# Patient Record
Sex: Male | Born: 1985 | Hispanic: No | Marital: Single | Smoking: Never smoker
Health system: Southern US, Community
[De-identification: ages and names within clinical notes are randomized; demographics above are authoritative.]

## PROBLEM LIST (undated history)

## (undated) DIAGNOSIS — F951 Chronic motor or vocal tic disorder: Secondary | ICD-10-CM

## (undated) DIAGNOSIS — F840 Autistic disorder: Secondary | ICD-10-CM

## (undated) DIAGNOSIS — T560X1A Toxic effect of lead and its compounds, accidental (unintentional), initial encounter: Secondary | ICD-10-CM

## (undated) DIAGNOSIS — G4700 Insomnia, unspecified: Secondary | ICD-10-CM

## (undated) DIAGNOSIS — F429 Obsessive-compulsive disorder, unspecified: Secondary | ICD-10-CM

## (undated) DIAGNOSIS — E7800 Pure hypercholesterolemia, unspecified: Secondary | ICD-10-CM

## (undated) DIAGNOSIS — T7840XA Allergy, unspecified, initial encounter: Secondary | ICD-10-CM

## (undated) DIAGNOSIS — F411 Generalized anxiety disorder: Secondary | ICD-10-CM

## (undated) HISTORY — DX: Insomnia, unspecified: G47.00

## (undated) HISTORY — DX: Generalized anxiety disorder: F41.1

## (undated) HISTORY — DX: Obsessive-compulsive disorder, unspecified: F42.9

## (undated) HISTORY — DX: Allergy, unspecified, initial encounter: T78.40XA

## (undated) HISTORY — DX: Pure hypercholesterolemia, unspecified: E78.00

## (undated) HISTORY — DX: Chronic motor or vocal tic disorder: F95.1

## (undated) HISTORY — DX: Toxic effect of lead and its compounds, accidental (unintentional), initial encounter: T56.0X1A

## (undated) HISTORY — DX: Autistic disorder: F84.0

---

## 2005-06-21 ENCOUNTER — Emergency Department (HOSPITAL_COMMUNITY): Admission: EM | Admit: 2005-06-21 | Discharge: 2005-06-21 | Payer: Self-pay | Admitting: Emergency Medicine

## 2009-01-05 ENCOUNTER — Ambulatory Visit (HOSPITAL_COMMUNITY): Admission: RE | Admit: 2009-01-05 | Discharge: 2009-01-05 | Payer: Self-pay | Admitting: Dentistry

## 2010-06-16 LAB — LIPID PANEL
HDL: 44 mg/dL (ref >39)
LDL Cholesterol: 161 mg/dL — ABNORMAL HIGH (ref 0–99)
Total CHOL/HDL Ratio: 5.2 RATIO
Triglycerides: 125 mg/dL (ref <150)
VLDL: 25 mg/dL (ref 0–40)

## 2010-06-16 LAB — COMPREHENSIVE METABOLIC PANEL
Albumin: 4.3 g/dL (ref 3.5–5.2)
Alkaline Phosphatase: 45 U/L (ref 39–117)
BUN: 10 mg/dL (ref 6–23)
Calcium: 9.3 mg/dL (ref 8.4–10.5)
Potassium: 4 mEq/L (ref 3.5–5.1)
Total Protein: 7.4 g/dL (ref 6.0–8.3)

## 2010-06-16 LAB — POCT I-STAT 4, (NA,K, GLUC, HGB,HCT): Glucose, Bld: 94 mg/dL (ref 70–99)

## 2010-06-16 LAB — CBC
HCT: 46.5 % (ref 39.0–52.0)
MCHC: 34.9 g/dL (ref 30.0–36.0)
Platelets: 252 10*3/uL (ref 150–400)
RDW: 13.6 % (ref 11.5–15.5)

## 2010-08-03 ENCOUNTER — Encounter: Payer: Self-pay | Admitting: *Deleted

## 2011-01-12 ENCOUNTER — Telehealth: Payer: Self-pay | Admitting: *Deleted

## 2011-01-12 NOTE — Telephone Encounter (Signed)
Called patient's father, Marcelene Butte re: form that had been dropped off for Dr.Knapp to fill out. Left message informing patient's father that she would not be able to sign off on this form because Eldwin hadn't been seen since 09/2008(last [facility] note), maybe Dr.Cunningham(his psychiatrist) would be able to. Instructed him to call if he had any questions or wanted to schedule an appt for [facility]. (form is on inside left of paper chart)

## 2013-06-18 ENCOUNTER — Telehealth: Payer: Self-pay | Admitting: *Deleted

## 2013-06-18 NOTE — Telephone Encounter (Signed)
Patient's father called requesting 2 referrals. First one to [facility] and also one to [facility] [phone] for speech evaluation for augmentative communication device. Patient has been healthy just having some behavioral issues. I called and set them up for tomorrow @ 2:45 for a 30 minute appt as patient has never been seen at this office. Is that ok?

## 2013-06-18 NOTE — Telephone Encounter (Signed)
That's fine

## 2013-06-19 ENCOUNTER — Encounter: Payer: Self-pay | Admitting: Family Medicine

## 2013-06-19 ENCOUNTER — Ambulatory Visit (INDEPENDENT_AMBULATORY_CARE_PROVIDER_SITE_OTHER): Payer: BC Managed Care – PPO | Admitting: Family Medicine

## 2013-06-19 VITALS — BP 118/80 | HR 80 | Ht 69.0 in | Wt 226.0 lb

## 2013-06-19 DIAGNOSIS — F951 Chronic motor or vocal tic disorder: Secondary | ICD-10-CM

## 2013-06-19 DIAGNOSIS — F840 Autistic disorder: Secondary | ICD-10-CM

## 2013-06-19 DIAGNOSIS — J309 Allergic rhinitis, unspecified: Secondary | ICD-10-CM | POA: Insufficient documentation

## 2013-06-19 DIAGNOSIS — F411 Generalized anxiety disorder: Secondary | ICD-10-CM | POA: Insufficient documentation

## 2013-06-19 DIAGNOSIS — F429 Obsessive-compulsive disorder, unspecified: Secondary | ICD-10-CM

## 2013-06-19 DIAGNOSIS — R4689 Other symptoms and signs involving appearance and behavior: Secondary | ICD-10-CM

## 2013-06-19 DIAGNOSIS — F603 Borderline personality disorder: Secondary | ICD-10-CM
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
## 2013-06-19 NOTE — Patient Instructions (Signed)
We will be getting those referrals done as requested.  Let us know if you have any further problems or needs.

## 2013-06-25 ENCOUNTER — Telehealth: Payer: Self-pay | Admitting: *Deleted

## 2013-06-25 NOTE — Telephone Encounter (Signed)
They specifically wanted a psychologist, not a psychiatrist--he already has two psychiatrists, who manage his medications.  They are concerned about his aggressive behavior of recent, and what could be causing it.  Psychiatrists have prescribed him medication (ativan) for prn use.  Was William Weeks aware of this?

## 2013-06-25 NOTE — Telephone Encounter (Signed)
William Weeks from [facility] called and their NP has reviewed the referral to see Dr.Altabet. They are stating that they cannot see the patient as patient needs to be referral to a psychiatrist-Dr.Altabet is a psychologist.  William Weeks [phone]

## 2013-06-26 NOTE — Telephone Encounter (Signed)
Called and spoke with [name]. She was not aware that William Weeks already had 2 psychiatrists and was seeking Dr.Altabet as a psychologist. She will let the NP know and call me back to let me know the outcome.

## 2015-01-25 ENCOUNTER — Ambulatory Visit (INDEPENDENT_AMBULATORY_CARE_PROVIDER_SITE_OTHER): Payer: BC Managed Care – PPO | Admitting: Medical

## 2015-01-25 ENCOUNTER — Encounter: Payer: Self-pay | Admitting: Medical

## 2015-01-25 VITALS — BP 110/80 | HR 67 | Wt 238.0 lb

## 2015-01-25 DIAGNOSIS — F840 Autistic disorder: Secondary | ICD-10-CM

## 2015-01-25 DIAGNOSIS — H1013 Acute atopic conjunctivitis, bilateral: Secondary | ICD-10-CM

## 2015-01-25 DIAGNOSIS — L819 Disorder of pigmentation, unspecified: Secondary | ICD-10-CM | POA: Diagnosis not present

## 2015-01-25 DIAGNOSIS — R3589 Other polyuria: Secondary | ICD-10-CM

## 2015-01-25 DIAGNOSIS — R358 Other polyuria: Secondary | ICD-10-CM | POA: Diagnosis not present

## 2015-01-25 DIAGNOSIS — E669 Obesity, unspecified: Secondary | ICD-10-CM | POA: Diagnosis not present

## 2015-01-25 DIAGNOSIS — L291 Pruritus scroti: Secondary | ICD-10-CM | POA: Diagnosis not present

## 2015-01-25 DIAGNOSIS — Z79899 Other long term (current) drug therapy: Secondary | ICD-10-CM

## 2015-01-25 LAB — POCT URINALYSIS DIPSTICK
BILIRUBIN UA: NEGATIVE
Blood, UA: NEGATIVE
GLUCOSE UA: NEGATIVE
Ketones, UA: NEGATIVE
Leukocytes, UA: NEGATIVE
Nitrite, UA: NEGATIVE
Protein, UA: NEGATIVE
SPEC GRAV UA: 1.02
UROBILINOGEN UA: NEGATIVE
pH, UA: 7.5

## 2015-01-25 MED ORDER — OLOPATADINE HCL 0.2 % OP SOLN: 1.0000 [drp] | Freq: Every day | OPHTHALMIC | Status: DC

## 2015-01-25 NOTE — Progress Notes (Signed)
Subjective: Chief Complaint  Patient presents with  . touching    said he has been touching him self frequently thinking it is an ocd thing or could have a rash also worried about testicle cancer. can not have blood test unless he is sedated. parents want to know what to do about that. said he is having discharge from his eyes. big bruise on lower back.    Here with his parents today.  This is my first visit with him or the family.   He has hx/o Autism, and parents note he is minimally verbal in being able to respond to questions.  Parents provide the history.   He normally sees Dr. Lynelle DoctorKnapp, but hasn't been seen in a while.    Here for multiple c/o.  [facility], and parents note that Dr. Lynelle DoctorKnapp had wanted him to have routine labs while under anesthesia since he is traumatized by labs otherwise.   Need order for labs.    Parents happen to notice a bruise or discoloration along his upper back recently.  Want this looked at.    He has had some irritation and itching of his eyes recently they attribute to allergies, but he did have one recent day where he had some purulent discharge from the eyes.  Mom took a picture to show me today but the pus cleared up and not currently having this symptoms.  There other concern regards him grabbing at his testicles.   They have told him it is innapropriate but not sure if something is going on. Parents have looked to see if there is rash or sore, but can't seem to find any problem.  They wonder if he is doing this due to OCD behaviors, attention seeking, or other.  He does seem to have obsession with urinating recently.   Denies polyuria, fever, abdominal pain or other concerning symptoms.    Past Medical History  Diagnosis Date  . Autism disorder   . OCD (obsessive compulsive disorder)   . Chronic vocal tic disorder   . GAD (generalized anxiety disorder) dr Marlyne Beardsjennings  . Asthma   . Allergy   . Lead toxicity h/o  .  Hypercholesteremia   . Insomnia, unspecified    ROS as in subjective   Objective: BP 110/80 mmHg  Pulse 67  Wt 238 lb (107.956 kg)  SpO2 98%  Gen: wd, wn, nad Patient seated, cooperates with parents, but guarded and cautious with me today Skin: thoracic spine midline with brown black chalky coloration similar to acanthosis nigricans appearance, but no similar around neck or breasts, no other rash Limited genital exam with no obvious deformity, scrotal mass, rash, hernia, or other abnormal finding.   Limited exam given patient lack of cooperation    Assessment: Encounter Diagnoses  Name Primary?  . Scrotal itching Yes  . Discoloration of skin   . Allergic conjunctivitis, bilateral   . Polyuria   . Obesity   . High risk medication use   . Autism    Plan: Gave script order for upcoming labs while under anesthesia.  Consulted with his PCP and my supervising physician Dr. Lynelle DoctorKnapp here today Scrotal itching - no obvious abnormality.  Possibly OCD vs attention seeking behavior.   Discussed concerns, recheck if rash or other new symptoms discoloration of skin - etiology unclear, possible acanthosis nigricans.  F/u pending labs. discussed with Dr. Lynelle DoctorKnapp Allergic conjunctivitis - advised no obvious sign of pink eye today.  Begin  trial of drops below, but call back if pus seen again Polyuria - UA reviewed, f/u pending labs f/u pending screening labs

## 2015-03-25 ENCOUNTER — Encounter: Payer: Self-pay | Admitting: Medical

## 2015-03-25 ENCOUNTER — Ambulatory Visit (INDEPENDENT_AMBULATORY_CARE_PROVIDER_SITE_OTHER): Payer: BC Managed Care – PPO | Admitting: Medical

## 2015-03-25 VITALS — BP 104/64 | HR 87 | Temp 96.8°F | Wt 233.0 lb

## 2015-03-25 DIAGNOSIS — J988 Other specified respiratory disorders: Secondary | ICD-10-CM

## 2015-03-25 MED ORDER — AZITHROMYCIN 250 MG PO TABS: ORAL_TABLET | ORAL | Status: DC

## 2015-03-25 NOTE — Progress Notes (Signed)
Subjective: Chief Complaint  Patient presents with  . chest cold    started a week ago. parents had to read symptoms since he does not speak much. cough is worsening over the last 24 hours.    Here for chest cold for about a week.  Stayed home from Day program last Friday, felt lethargic, droopy eyes.  Felt same for the next 4 days, but in last 24 hours more frequent deep and wet cough.  Possibly subjective fever.  No NVD, no wheezing.  +nasal and chest congestion, wiped out seeming.   No SOB.  Had oral surgery 2 weeks, was on antibiotics after that, recovered from that well.  Appetite seems decreased, has skipped a few meals.  Hasn't had to use inhaler.  No other aggravating or relieving factors. No other complaint.  Past Medical History  Diagnosis Date  . Autism disorder   . OCD (obsessive compulsive disorder)   . Chronic vocal tic disorder   . GAD (generalized anxiety disorder) dr Marlyne Beardsjennings  . Asthma   . Allergy   . Lead toxicity h/o  . Hypercholesteremia   . Insomnia, unspecified    ROS as in subjective   Objective: BP 104/64 mmHg  Pulse 87  Temp(Src) 96.8 F (36 C) (Tympanic)  Wt 233 lb (105.688 kg)  General appearance: Alert, WD/WN, no distress, mildly ill appearing, cough that is rattly                             Skin: warm, no rash, no diaphoresis                           Head: no sinus tenderness                            Eyes: conjunctiva normal, corneas clear, PERRLA                            Ears: pearly TMs, external ear canals normal                          Nose: septum midline, turbinates swollen, with erythema and clear discharge             Mouth/throat: MMM, tongue normal, mild pharyngeal erythema                           Neck: supple, no adenopathy, no thyromegaly, non tender                          Heart: RRR, normal S1, S2, no murmurs                         Lungs: +bronchial breath sounds, no rhonchi, no wheezes, no rales                Extremities:  no edema, non tender     Assessment: Encounter Diagnosis  Name Primary?  Marland Kitchen. Respiratory tract infection Yes    Plan: Discussed symptoms, exam limited due to patient inability to fully cooperate with exam.   C/t mucinex DM, hydration, add albuterol 1-2 times daily the next few days, and add Zpak.  Call/return if worse or  not resolved within next several days.

## 2015-10-06 ENCOUNTER — Ambulatory Visit (INDEPENDENT_AMBULATORY_CARE_PROVIDER_SITE_OTHER): Payer: BC Managed Care – PPO | Admitting: Family Medicine

## 2015-10-06 ENCOUNTER — Ambulatory Visit
Admission: RE | Admit: 2015-10-06 | Discharge: 2015-10-06 | Disposition: A | Discharge status: Home / Self Care | Payer: BC Managed Care – PPO | Source: Ambulatory Visit | Attending: Family Medicine | Admitting: Family Medicine

## 2015-10-06 ENCOUNTER — Encounter: Payer: Self-pay | Admitting: Family Medicine

## 2015-10-06 VITALS — BP 124/70 | HR 76 | Ht 69.0 in | Wt 243.2 lb

## 2015-10-06 DIAGNOSIS — F951 Chronic motor or vocal tic disorder: Secondary | ICD-10-CM

## 2015-10-06 DIAGNOSIS — M79672 Pain in left foot: Secondary | ICD-10-CM

## 2015-10-06 DIAGNOSIS — E669 Obesity, unspecified: Secondary | ICD-10-CM | POA: Diagnosis not present

## 2015-10-06 DIAGNOSIS — R0683 Snoring: Secondary | ICD-10-CM | POA: Diagnosis not present

## 2015-10-06 DIAGNOSIS — M25572 Pain in left ankle and joints of left foot: Secondary | ICD-10-CM

## 2015-10-06 DIAGNOSIS — J309 Allergic rhinitis, unspecified: Secondary | ICD-10-CM | POA: Diagnosis not present

## 2015-10-06 DIAGNOSIS — F840 Autistic disorder: Secondary | ICD-10-CM | POA: Diagnosis not present

## 2015-10-06 MED ORDER — MONTELUKAST SODIUM 10 MG PO TABS
10.0000 mg | ORAL_TABLET | Freq: Every day | ORAL | 5 refills | Status: DC
Start: 2015-10-06 — End: 2017-04-16

## 2015-10-06 NOTE — Progress Notes (Signed)
Chief Complaint  Patient presents with  . Foot Swelling    3 times in the last couple of months he has woken up and his ankle/foot (L) has been swollen. Having a hard to walking on.  . Wheezing    comes and goes over the last week or so, has used albuterol few times over the last week and taking daily claritin, but not helping.    He also brought in a form needing completion, that clearly states annual physical/medical exam.  He was scheduled for an acute visit for foot swelling today.  He is accompanied by both his parents today.  He presents for evaluation of left foot/ankle pain.  He could barely walk this morning when mom woke him up.  He was wincing, seemed to be in pain.   She reports that his caregiver noted that he was limping a little yesterday when they were walking, but he seemed fine last night (running up and down the stairs). He walks on his toes.  He walked a lot in old flip flops yesterday.  As far as they know, there was know known injury or trauma.  OCD behavior at night, a lot of up and down (climbing on chair to look for food, repeatedly).  There were 2 other similar occasions, with foot/ankle pain. He would go to bed fine, and in the morning it would be painful/swollen.  One of the other occasions was after an episode of the behavior described above, and was on the right foot. They iced it, gave a pain medication and he recovered quickly.  He was given 2 tylenol today, which seems to have helped, but still appears to be in a lot of pain when walking.  The next concern is that of possible wheezing.  He has h/o asthma as a child, which mother reports he outgrew. This summer, his allergies have been flaring--runny nose, sneezing, itchy eyes.  Taking Claritin D daily, but it doesn't seem to be helping.  Mother reports she has heard some wheezing--a whistle in the upper chest, while watching TV.  No fast breathing or distress, no coughing. Food obsessions--he had a couple of weeks  with excessive eating (found access to key for food).  He vomited after overeating. No vomiting since. No belching.  No apparent shortness of breath with activity.  Mother has OSA, and she is concerned that Maxmillian might as well.  She reports that he snores very loudly.  Only occasionally noted to startle.  PMH, PSH, SH reviewed  Outpatient Encounter Prescriptions as of 10/06/2015  Medication Sig Note  . albuterol (PROVENTIL HFA;VENTOLIN HFA) 108 (90 BASE) MCG/ACT inhaler Inhale 1 puff into the lungs every 6 (six) hours as needed for wheezing or shortness of breath. Reported on 03/25/2015 03/25/2015: Needs refill  . chloral hydrate 500 MG/5ML syrup Take 750 mg by mouth at bedtime as needed.  08/03/2010: Extra 1/2 teaspoon, if needed.  . clomiPRAMINE (ANAFRANIL) 25 MG capsule Take 25 mg by mouth daily.   . cloNIDine (CATAPRES) 0.1 MG tablet Take 0.2 mg by mouth at bedtime.    . Fluvoxamine Maleate 150 MG CP24 Take 150 mg by mouth daily.  03/25/2015: 100mg  twice a day  . OLANZapine (ZYPREXA) 2.5 MG tablet Take 2.5 mg by mouth at bedtime.   . propranolol (INDERAL) 10 MG tablet Take 10 mg by mouth 2 (two) times daily.   . [DISCONTINUED] Olopatadine HCl (PATADAY) 0.2 % SOLN Apply 1 drop to eye daily.   . montelukast (  SINGULAIR) 10 MG tablet Take 1 tablet (10 mg total) by mouth at bedtime.   . [DISCONTINUED] albuterol (PROVENTIL,VENTOLIN) 2 MG/5ML syrup Take 2 mg by mouth as needed. Reported on 03/25/2015 06/19/2013: Uses once or twice a year, only when sick with URI  . [DISCONTINUED] azithromycin (ZITHROMAX) 250 MG tablet 2 tablets day 1, then 1 tablet days 2-4   . [DISCONTINUED] cetirizine (ZYRTEC) 10 MG tablet Take 10 mg by mouth daily.   . [DISCONTINUED] cloNIDine HCl (KAPVAY) 0.1 MG TB12 ER tablet Take 0.1 mg by mouth 2 (two) times daily. Reported on 03/25/2015   . [DISCONTINUED] LORazepam (ATIVAN) 0.5 MG tablet Reported on 03/25/2015 06/19/2013: Needing daily over the last week, occasionally needing a  second dose 30 minutes later   No facility-administered encounter medications on file as of 10/06/2015.    (montelukast rx'd today, not prior to visit).  Allergies  Allergen Reactions  . Benadryl [Diphenhydramine Hcl]   . Buspar [Buspirone Hcl]    ROS:  No known fever, discolored mucus, cough, shortness of breath. Nausea, vomiting (just 2-3 weeks ago with overeating).  No diarrhea, bleeding, bruising, rash or other concerns, except as noted in HPI  PHYSICAL EXAM: BP 124/70   Pulse 76   Ht 5\' 9"  (1.753 m)   Wt 243 lb 3.2 oz (110.3 kg)   BMI 35.91 kg/m   Pleasant, cooperative obese male in no distress. Intermittently making loud noises, but overall very calm.  He is playing with a fidget spinner. HEENT: PERRL, EOMI, conjunctiva and sclera are clear.  OP is clear. Nasal mucosa is moderately edematous, some crusting and clear mucus. No erythema or purulence Neck: no lymphadenopathy, thyromegaly or mass Heart: regular rate and rhythm without murmur Lungs: clear bilaterally.  Not following directions to take deep breaths--decreased air movement posteriorly, but not taking deep breaths.  Anteriorly there is good air movement, no wheezes, rales or ronchi noted Extremities: minimal soft tissue swelling noted, no erythema.  He has some discomfort during exam--only can tell by his facial expressions wincing.  Appears to have some diffuse tenderness, overlying the lateral malleolus, to the lateral and forefoot.  2+ pulses  ASSESSMENT/PLAN:  Left ankle pain - r/o fx given lack of good history.  Suspect strain or sprain. supportive management (compression/ice/elevation) - Plan: DG Ankle Complete Left  Left foot pain - Plan: DG Foot Complete Left  Allergic rhinitis, unspecified allergic rhinitis type - try change antihistamine to Zyrtec-D. Add montelukast if not improving. Doubt would be able to properly use nasal steroid - Plan: montelukast (SINGULAIR) 10 MG tablet  Snoring - contributed by  nasal congestion. at risk for OSA. Weight loss encouraged. Observe for apneic episodes  Obesity (BMI 35.0-39.9 without comorbidity) (HCC)  Chronic vocal tic disorder  Autism disorder - stable. OCD and anxiety as well.  Appears much calmer and tolerant of exam than at prior visits (on different meds now)    Try and wear supportive shoes. I recommend an ankle support (ACE wrap or lace-up ankle brace). Ice and elevate the left foot today. Use ibuprofen or Aleve for the next few days until better. Go to [facility] ([address]) today for x-rays  Allergies:  Try changing back to Zyrtec-D to see if it is more effective. If not help with his allergies more than the Claritin, then start the montelukast.  This needs to be taken every day, and takes up to 2 weeks to see the full effect).  I don't hear any significant  wheezing today, and he doesn't seem to be demonstrating signs of trouble breathing or shortness of breath.  If he does start this, let us know (breathing fast, needing to take breaks with exercise, coughing a lot).  Please listen to his breathing--if he has frequent pauses in his breathing (breath holding, followed by a loud snort and starting breathing again) this could be a sign of sleep apnea.  Weight loss will help.  If he is just snoring, that may be related to the nasal congestion.  Treating the allergies should help. Consider saline nasal spray. Consider mucinex or robitussin if having any thick mucus or phlegm, or any cough.   Labs from [facility] reviewed--lipids only briefly discussed. To return for CPE/forms (may see Vincenza Hews if needed sooner)  25-30 min visit, more than 1/2 spent counseling

## 2015-10-06 NOTE — Patient Instructions (Signed)
  Try and wear supportive shoes. I recommend an ankle support (ACE wrap or lace-up ankle brace). Ice and elevate the left foot today. Use ibuprofen or Aleve for the next few days until better. Go to [facility] ([address]) today for x-rays  Allergies:  Try changing back to Zyrtec-D to see if it is more effective. If not help with his allergies more than the Claritin, then start the montelukast.  This needs to be taken every day, and takes up to 2 weeks to see the full effect).  I don't hear any significant wheezing today, and he doesn't seem to be demonstrating signs of trouble breathing or shortness of breath.  If he does start this, let us know (breathing fast, needing to take breaks with exercise, coughing a lot).  Please listen to his breathing--if he has frequent pauses in his breathing (breath holding, followed by a loud snort and starting breathing again) this could be a sign of sleep apnea.  Weight loss will help.  If he is just snoring, that may be related to the nasal congestion.  Treating the allergies should help. Consider saline nasal spray. Consider mucinex or robitussin if having any thick mucus or phlegm, or any cough.

## 2016-04-17 ENCOUNTER — Ambulatory Visit (INDEPENDENT_AMBULATORY_CARE_PROVIDER_SITE_OTHER): Payer: BC Managed Care – PPO | Admitting: Family Medicine

## 2016-04-17 ENCOUNTER — Encounter: Payer: Self-pay | Admitting: Family Medicine

## 2016-04-17 VITALS — BP 108/62 | HR 80 | Ht 70.0 in | Wt 242.0 lb

## 2016-04-17 DIAGNOSIS — Z23 Encounter for immunization: Secondary | ICD-10-CM

## 2016-04-17 DIAGNOSIS — Z025 Encounter for examination for participation in sport: Secondary | ICD-10-CM | POA: Diagnosis not present

## 2016-04-17 DIAGNOSIS — R21 Rash and other nonspecific skin eruption: Secondary | ICD-10-CM

## 2016-04-17 NOTE — Progress Notes (Signed)
Chief Complaint  Patient presents with  . Med check    has form for [employer] to be filled out. Also has a rash on his neck that has gotten somewhat better that they would like for you to look at.     Rash on the right lateral neck about 2 weeks ago.  He picked at it and was bleeding.  It has been healing, but his father wanted to have it looked at.  No new lesions, ongoing bleeding, pain. Used some antibacterial ointment.  He brings in form, requiring physical exam, to participate in the Special Olympics. He plans to participate in bowling and free-throw shooting. He has done this in the past. No problems with exercise--no shortnes of breath, cough, chest pain  Wheezes ony rarely, related to allergies, if high pollen counts; never has wheezing related to activity.  PMH, PSH, SH reviewed. He [facility] in 02/[facility], at which time he had routine labs checked.  He was supposed to have tetanus vaccination as well--no records were ever received. Reviewing the available material in CareEverywhere--no documentation of any vaccination being given during that procedure. Date of last tetanus is required for his participation in special olympics.  Outpatient Encounter Prescriptions as of 04/17/2016  Medication Sig Note  . chloral hydrate 500 MG/5ML syrup Take 750 mg by mouth at bedtime as needed.  08/03/2010: Extra 1/2 teaspoon, if needed.  . clomiPRAMINE (ANAFRANIL) 25 MG capsule Take 25 mg by mouth daily.   . clonazePAM (KLONOPIN) 0.5 MG tablet Take 0.5 mg by mouth 2 (two) times daily.    . cloNIDine (CATAPRES) 0.1 MG tablet Take 0.2 mg by mouth at bedtime.    . Fluvoxamine Maleate 150 MG CP24 Take 150 mg by mouth daily.  03/25/2015: 100mg  twice a day  . montelukast (SINGULAIR) 10 MG tablet Take 1 tablet (10 mg total) by mouth at bedtime.   Marland Kitchen. OLANZapine (ZYPREXA) 2.5 MG tablet Take 2.5 mg by mouth at bedtime.   . propranolol (INDERAL) 10 MG tablet Take 10 mg by  mouth 2 (two) times daily.   Marland Kitchen. albuterol (PROVENTIL HFA;VENTOLIN HFA) 108 (90 BASE) MCG/ACT inhaler Inhale 1 puff into the lungs every 6 (six) hours as needed for wheezing or shortness of breath. Reported on 03/25/2015 03/25/2015: Needs refill  . [DISCONTINUED] OLANZapine (ZYPREXA) 2.5 MG tablet Take 2.5 mg by mouth.   . [DISCONTINUED] propranolol (INDERAL) 10 MG tablet Take 10 mg by mouth.    No facility-administered encounter medications on file as of 04/17/2016.    Allergies  Allergen Reactions  . Benadryl [Diphenhydramine Hcl]   . Buspar [Buspirone Hcl]    ROS:  No fever, URI symptoms, shortness of breath, swelling, joint pains, mood changes or other complaints reported by father.  PHYSICAL EXAM: BP 108/62 (BP Location: Left Arm, Patient Position: Sitting, Cuff Size: Normal)   Pulse 80   Ht 5\' 10"  (1.778 m)   Wt 242 lb (109.8 kg)   BMI 34.72 kg/m   Well appearing male, in no distress HEENT: PERRL, EOMI, conjunctiva and sclera clear.  Vision 20/20. Grossly normal hearing. OP is clear Neck: no lymphadenopathy, thyromegaly or mass 3-4 healing papules at the right lateral neck, and inferiorly there is a hyperpigmented area (which father believes may have also been red recently, but healing). No pustules, drainage, nontender Heart: regular rate and rhythm, no murmur Lungs: clear bilaterally, no wheezing Back: no CVA or spinal tenderness Abdomen: soft, nontender, no mass, obese Extremities:  no edema Neuro: alert. Limited speech but follows directions.  Normal strength, DTR's 2-3+ and symmetric.  Normal gait Psych: normal mood, affect--appears calm, not anxious/agitated or excessive movements or abnormal behavior during today's visit  ASSESSMENT/PLAN:   Encounter for sports participation examination  Need for Tdap vaccination - Plan: Tdap vaccine greater than or equal to 7yo IM  Rash - suspected folliculitis vs contact dermatitis, that was irritated, but is now healing; no evidence  of infection  Labs [facility]  Chol/HDL 5.0 HDL 36, tg 152, LDL 114 Vit D 1,25-dihydroxy  30 Normal thyroid  Father was very hesitant/anxious about his son receiving Tdap--worried he would not cooperate.  He appeared to be very calm and trusting today--allowing [place] to get his BP, look in his ears, etc, so I suggested we try--with just [place] and I in the room, two people he knows and trusts. He tolerated this just fine, watched it be given.  Father was happily surprised.   Counseled re: expected muscle soreness, signs/symptoms of infection.  Form filled out for participation in special olympics

## 2016-08-18 ENCOUNTER — Telehealth: Payer: Self-pay

## 2016-08-18 MED ORDER — ALBUTEROL SULFATE HFA 108 (90 BASE) MCG/ACT IN AERS: 1.0000 | INHALATION_SPRAY | Freq: Four times a day (QID) | RESPIRATORY_TRACT | 0 refills | Status: AC | PRN

## 2016-08-18 NOTE — Telephone Encounter (Signed)
Ok to refill as requested.  #1, no additional refill

## 2016-08-18 NOTE — Telephone Encounter (Signed)
Mom called requesting refill of pts inhaler and mask to deliver the inhaler. [facility] [address] Trixie Rude/RLB

## 2016-08-18 NOTE — Telephone Encounter (Signed)
Pt mom called back and would [facility] [address], [place], [place] [id number]

## 2017-04-16 ENCOUNTER — Ambulatory Visit
Admission: RE | Admit: 2017-04-16 | Discharge: 2017-04-16 | Disposition: A | Discharge status: Home / Self Care | Payer: Medicaid Other | Source: Ambulatory Visit | Attending: Family Medicine | Admitting: Family Medicine

## 2017-04-16 ENCOUNTER — Ambulatory Visit (INDEPENDENT_AMBULATORY_CARE_PROVIDER_SITE_OTHER): Payer: Medicaid Other | Admitting: Family Medicine

## 2017-04-16 ENCOUNTER — Encounter: Payer: Self-pay | Admitting: Family Medicine

## 2017-04-16 VITALS — BP 110/68 | HR 88 | Ht 70.0 in | Wt 246.2 lb

## 2017-04-16 DIAGNOSIS — M79671 Pain in right foot: Secondary | ICD-10-CM

## 2017-04-16 NOTE — Progress Notes (Signed)
Chief Complaint  Patient presents with  . Foot Pain    right foot x 2 weeks, some days worse than others and there is some swelling. Jumped down laundry shoot about 3 weeks ago-pain started about a week after so not sure if it's related.    Patient presets accompanied by his father with complaint of right foot pain.  He has been limping x 2 weeks. About 3 weeks ago he jumped down laundry chute from 2nd to 1sdt floor. Was not limping until about a week later. No other known injury, change in activity. He did get some new shoes around the time the pain started. Doesn't think they are tight, has been wearing them a lot.   Some days he can take tolerate walks without too much pain (still favors it). Other times he can barely put weight on it.  Denies any changes in diet (rotisserie chicken, rice and beans).  They have been icing it, and using Advil (once or twice daily, OTC dose). Some days he is fine, other days he can "barely walk".   PMH, PSH, SH reviewed  Outpatient Encounter Medications as of 04/16/2017  Medication Sig Note  . chloral hydrate 500 MG/5ML syrup Take 750 mg by mouth at bedtime as needed.  08/03/2010: Extra 1/2 teaspoon, if needed.  . clomiPRAMINE (ANAFRANIL) 25 MG capsule Take 25 mg by mouth daily.   . clonazePAM (KLONOPIN) 0.5 MG tablet Take 0.5 mg by mouth 2 (two) times daily.    . cloNIDine (CATAPRES) 0.1 MG tablet Take 0.2 mg by mouth at bedtime.    . Fluvoxamine Maleate 150 MG CP24 Take 150 mg by mouth daily.  03/25/2015: 100mg  twice a day  . OLANZapine (ZYPREXA) 2.5 MG tablet Take 2.5 mg by mouth at bedtime.   . propranolol (INDERAL) 10 MG tablet Take 10 mg by mouth 2 (two) times daily.   . [DISCONTINUED] montelukast (SINGULAIR) 10 MG tablet Take 1 tablet (10 mg total) by mouth at bedtime.   Marland Kitchen. albuterol (PROVENTIL HFA;VENTOLIN HFA) 108 (90 Base) MCG/ACT inhaler Inhale 1 puff into the lungs every 6 (six) hours as needed for wheezing or shortness of breath. Reported on  03/25/2015 (Patient not taking: Reported on 04/16/2017)    No facility-administered encounter medications on file as of 04/16/2017.    Allergies  Allergen Reactions  . Benadryl [Diphenhydramine Hcl]   . Buspar [Buspirone Hcl]    ROS: no fever, chills, GI complaints, other joint concerns, rashes  PHYSICAL EXAM: BP 110/68   Pulse 88   Ht 5\' 10"  (1.778 m)   Wt 246 lb 3.2 oz (111.7 kg)   BMI 35.33 kg/m   Well appearing, pleasant, cooperative male. He is watching videos on dad's phone.  Repeats some words back that are said to him, difficult to get history, had to read facial expressions when looking for pain.   Right foot-- Slight swelling and redness at the 1st through 3rd MTP's/distal metatarsals. Did not appear to have pain when moving the great toe, but perhaps some discomfort when pressing on medial aspect of the 1st MTP (made eye contact at that point, though didn't grimace ever). Overlying skin is just slightly pink in area described. Brisk cap refill, normal pulses, normal turgor  ASSESSMENT/PLAN:  Right foot pain - Plan: DG Foot Complete Right    Go to [facility] ([address]) for x-ray of the right foot. If x-ray doesn't show any fracture, continue to ice and elevate until the swelling and  discomfort improves. If no fracture seen, then I recommend taking Aleve (generic naproxen) 220 mg tablets--take two tablets twice daily with food (breakfast and dinner) for up to 10-14 days (take it regularly until pain has completely resolved).   Call [phone] dad's cell phone with results when available. Okay to leave detailed VM

## 2017-04-16 NOTE — Patient Instructions (Signed)
Go to [facility] ([address]) for x-ray of the right foot. Continue to ice and elevate until the swelling and discomfort improves. If no fracture seen, then I recommend taking Aleve (generic naproxen) 220 mg tablets--take two tablets twice daily with food (breakfast and dinner) for up to 10-14 days (take it regularly until pain has completely resolved).

## 2017-05-17 ENCOUNTER — Telehealth: Payer: Self-pay | Admitting: Family Medicine

## 2017-05-17 NOTE — Telephone Encounter (Signed)
There are certain foods to avoid to prevent gout flares (low purine diet--there should be info in epic on AVS that maybe we can print and mail, or he can look up resources from reliable websites such as [facility]).  Blood tests would be helpful (not to be done in the midst of a flare though, so not needed urgently).  Continue to use the aleve to relieve his pain, but should schedule visit with me, and we can try and draw labs at the visit (he did so well with the tetanus shot, maybe we can try and work out drawing blood in the office at the visit).  Checking a uric acid level (as well as other tests) can be helpful to know if he needs to be put on a daily medication for prevention (to lower uric acid levels and prevent gout flares, if it is high).

## 2017-05-17 NOTE — Telephone Encounter (Signed)
Pt's dad, Chrissie NoaWilliam, called stating that pt is having left foot pain that is just like the right foot pain that he had recently. Pt took Aleve has Dr Lynelle DoctorKnapp instructed last time and the left foot is a little better this morning. Dad is now concerned that pt may have gout as Dr Lynelle DoctorKnapp suspected and what to know what they should do going forward.

## 2017-06-03 NOTE — Progress Notes (Signed)
Chief Complaint  Patient presents with  . Foot Pain    left foot pain started March 5th and lasted 3-4 days. Cannt walk on the first day and then gets better over time. Would like to possibly have discussion about testing for gout.     [name] (father) called on 3/7 reporting that William Weeks developed left foot pain that is just like the right foot pain that he had been seen for in February. Pt took Aleve as directed at last visit, which helped with the pain.  He presents today for some lab evaluation for his pain.  Per dad, it looked swollen, ?bump on the top of the foot, cannot tell/remember if it was red.  Woke up in the morning moaning, was fine the night before, with no history of trauma or change in activity. Didn't look like a bite. It was over the top of the foot. Aleve 2 twice/d x 5 days and pain resolved.  This is now his third episode of foot pain. He recalls that the first was about 1.5 years ago, then 2 more recently (last visit, when pain was on right foot, and recent episode on the left).  He denies any changes to diet or medications  PMH, PSH, SH reviewed  Outpatient Encounter Medications as of 06/04/2017  Medication Sig Note  . chloral hydrate 500 MG/5ML syrup Take 750 mg by mouth at bedtime as needed.  08/03/2010: Extra 1/2 teaspoon, if needed.  . clomiPRAMINE (ANAFRANIL) 25 MG capsule Take 25 mg by mouth daily.   . clonazePAM (KLONOPIN) 0.5 MG tablet Take 0.5 mg by mouth 2 (two) times daily.    . cloNIDine (CATAPRES) 0.1 MG tablet Take 0.2 mg by mouth at bedtime.    . Fluvoxamine Maleate 150 MG CP24 Take 150 mg by mouth daily.  03/25/2015: 158m twice a day  . OLANZapine (ZYPREXA) 2.5 MG tablet Take 2.5 mg by mouth at bedtime.   . propranolol (INDERAL) 10 MG tablet Take 10 mg by mouth 2 (two) times daily.   .Marland Kitchenalbuterol (PROVENTIL HFA;VENTOLIN HFA) 108 (90 Base) MCG/ACT inhaler Inhale 1 puff into the lungs every 6 (six) hours as needed for wheezing or shortness of breath.  Reported on 03/25/2015 (Patient not taking: Reported on 04/16/2017)    No facility-administered encounter medications on file as of 06/04/2017.    Allergies  Allergen Reactions  . Benadryl [Diphenhydramine Hcl]   . Buspar [Buspirone Hcl]    ROS: no fever, chills, rashes, bleeding, bruising or other joint pains. Currently without any complaints today.  PHYSICAL EXAM:  BP 110/68   Pulse 80   Ht 5' 10"  (1.778 m)   Wt 243 lb 9.6 oz (110.5 kg)   BMI 34.95 kg/m   Pleasant, well-appearing male, in no distress.  Somewhat anxious to leave, but remained patient and calm, easily pacified by his dad. Left foot--normal.  No erythema, swelling, nontender, normal pulses He appears somewhat anxious (looking very closely at everything in room being done), but not particularly fidgety, and remained calm  Attempted blood draw today (by VVerdene Lennert with MD also present, as he knows [place], tolerated vaccine given by u[place]oreain past)--unfortunately, unable to get blood after two attempts today.  He was patient and tolerate very well, but veins rolled, and V was unable to draw blood.  ASSESSMENT/PLAN:  Left foot pain - Ddx reviewed, including gout, vs trauma, tendonitis. Unable to draw blood--given info. NSAIDs for recurrent sx. Consider retry when better hydrated - Plan: CANCELED:  CBC with Differential/Platelet, CANCELED: Comprehensive metabolic panel, CANCELED: Uric acid, CANCELED: Sedimentation Rate, CANCELED: ANA,IFA RA Diag Pnl w/rflx Tit/Patn   Likely really just needs uric acid to r/o gout, but will go ahead and try and check other labs, especially since only other labs were done 02/2015. Rec labs; CBC, ESR, uric acid, ANA with reflex, c-met

## 2017-06-04 ENCOUNTER — Ambulatory Visit (INDEPENDENT_AMBULATORY_CARE_PROVIDER_SITE_OTHER): Payer: Medicaid Other | Admitting: Family Medicine

## 2017-06-04 ENCOUNTER — Encounter: Payer: Self-pay | Admitting: Family Medicine

## 2017-06-04 VITALS — BP 110/68 | HR 80 | Ht 70.0 in | Wt 243.6 lb

## 2017-06-04 DIAGNOSIS — M79672 Pain in left foot: Secondary | ICD-10-CM | POA: Diagnosis not present

## 2017-06-04 NOTE — Patient Instructions (Signed)

## 2018-07-26 IMAGING — CR DG FOOT COMPLETE 3+V*R*
4 series · 4 of 4 positions shown · non-contrast
Comparison: None.

CLINICAL DATA: Right foot pain for 1 week, no known injury, initial
encounter

EXAM:
RIGHT FOOT COMPLETE - 3+ VIEW

[x foot ap right]
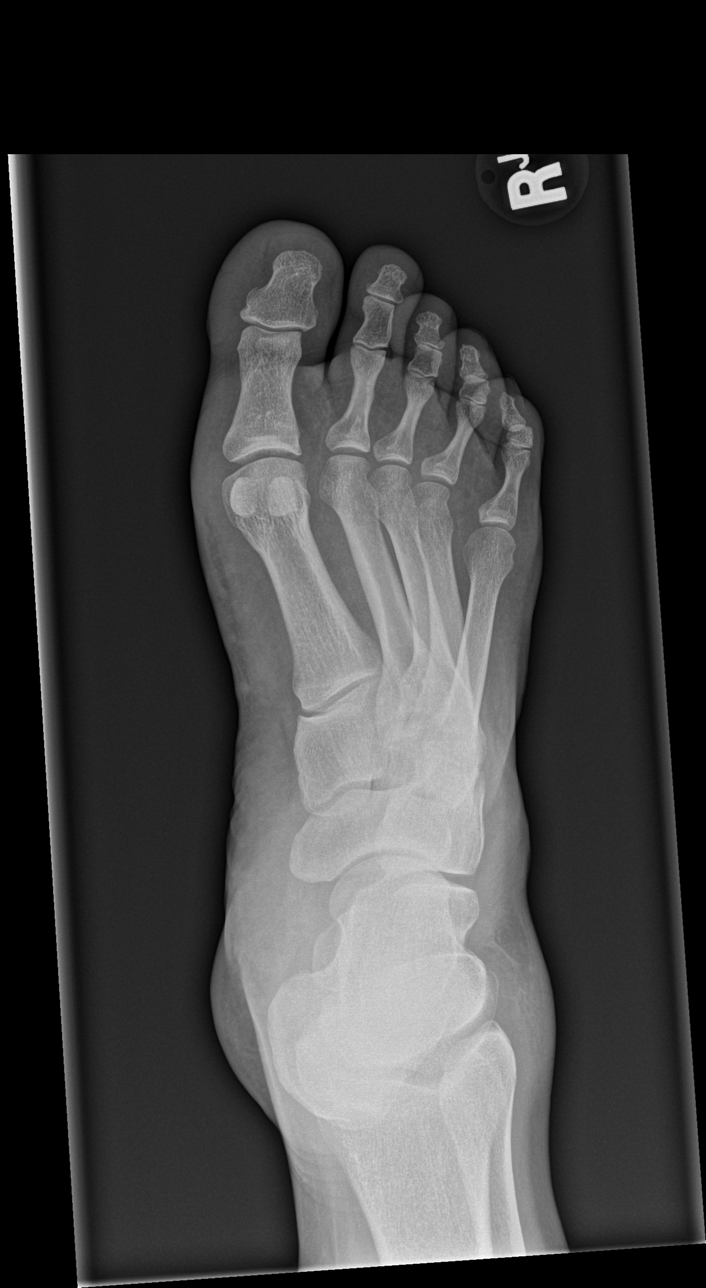

[x foot lat right]
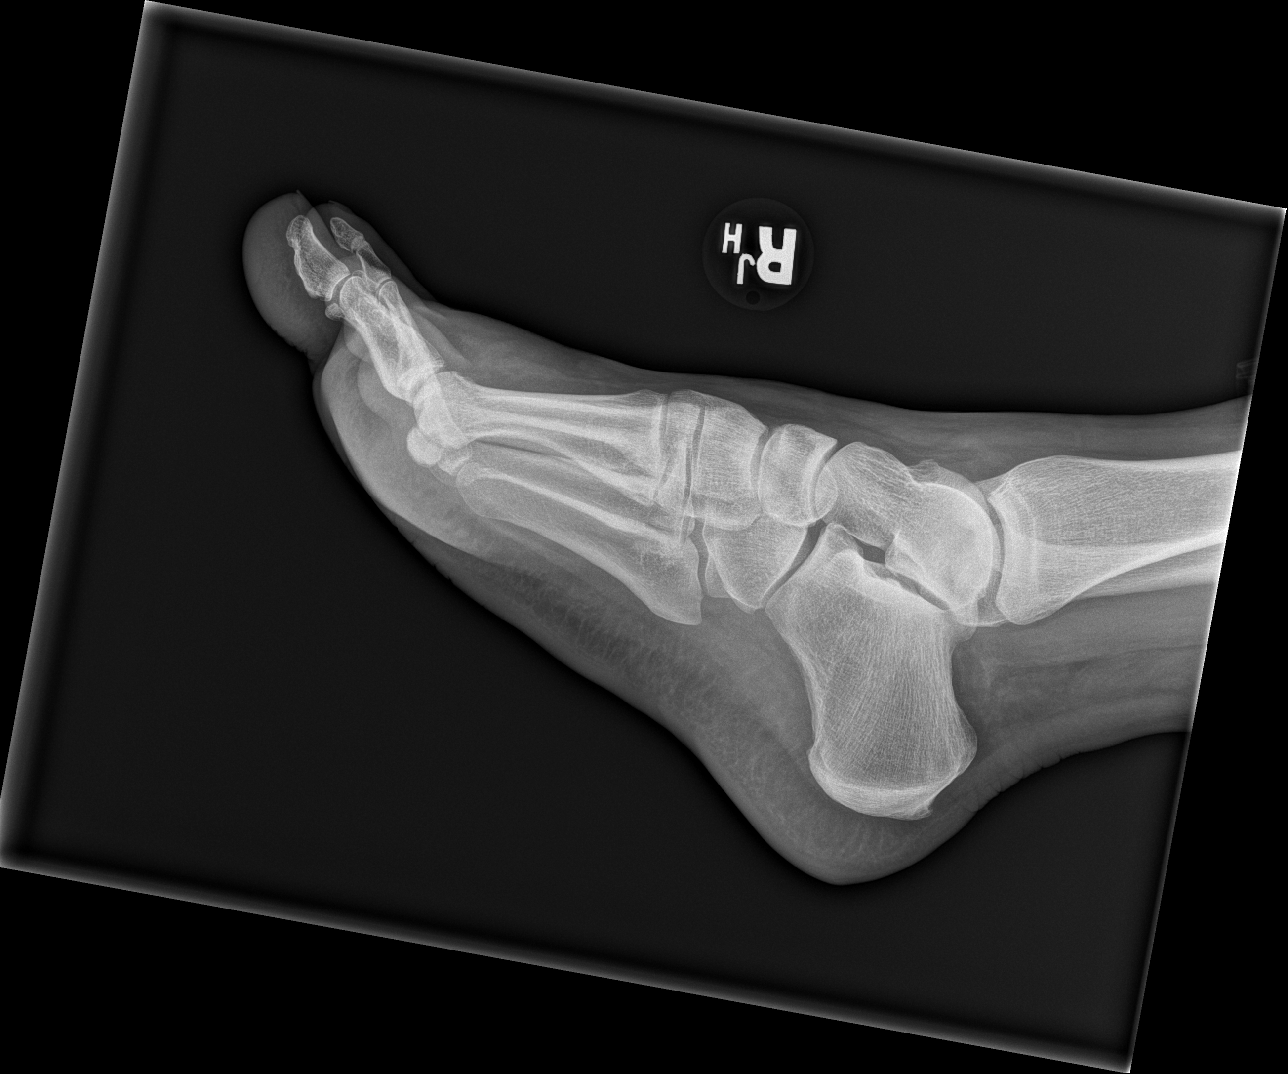

[x foot obl right (1 of 2)]
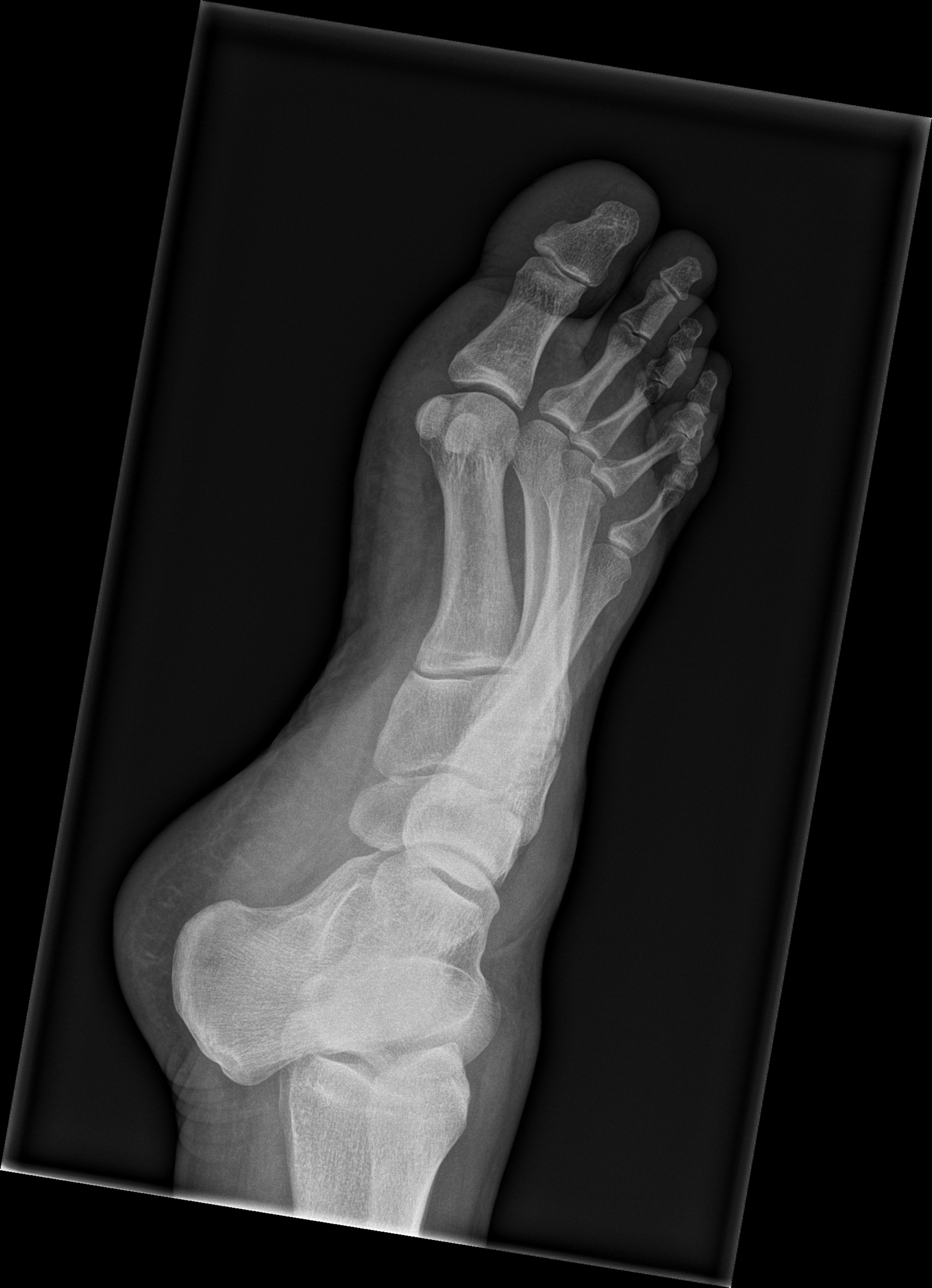

[x foot obl right (2 of 2)]
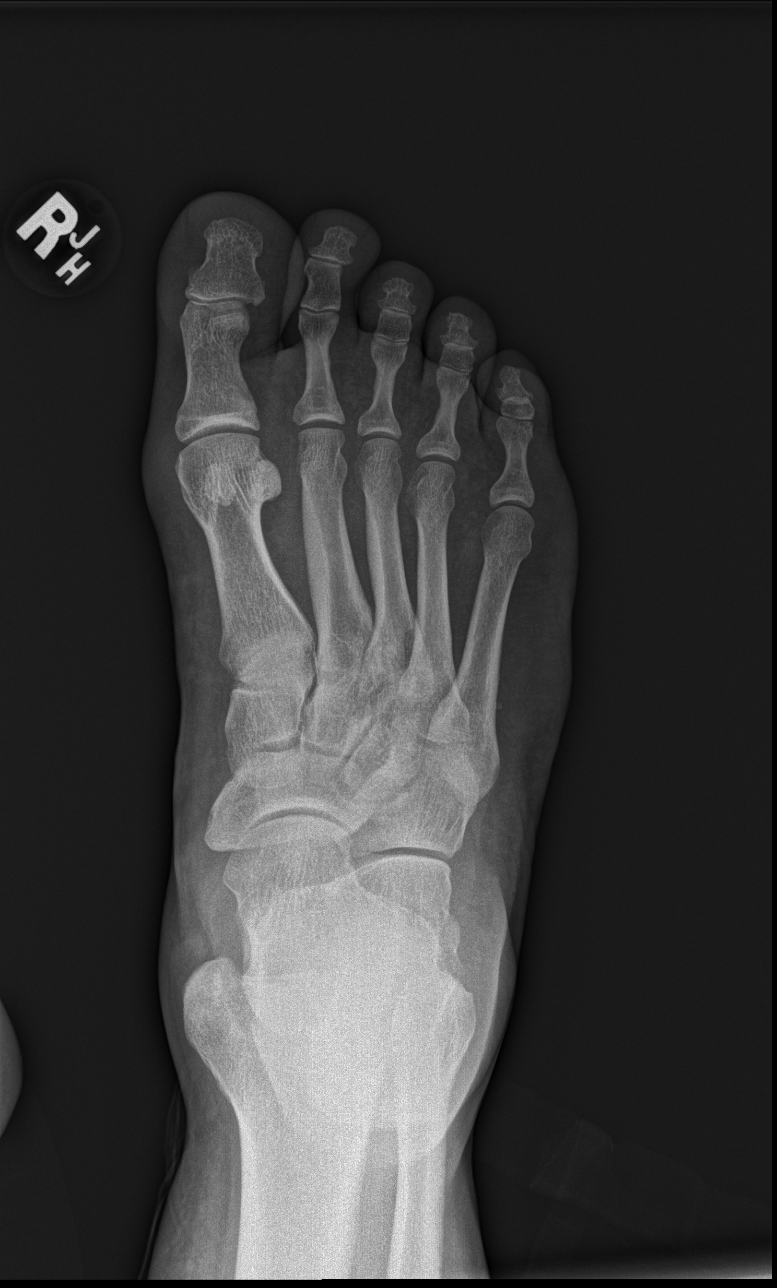

[4 of 4 positions shown; findings below may reference images not displayed]

FINDINGS: No acute fracture or dislocation is noted. No gross soft tissue
abnormality is seen.
IMPRESSION: No acute abnormality noted.

## 2018-11-12 ENCOUNTER — Encounter: Payer: Self-pay | Admitting: Family Medicine

## 2019-02-05 ENCOUNTER — Other Ambulatory Visit: Payer: Self-pay

## 2019-02-05 ENCOUNTER — Other Ambulatory Visit (INDEPENDENT_AMBULATORY_CARE_PROVIDER_SITE_OTHER): Payer: Medicare Other

## 2019-02-05 DIAGNOSIS — Z23 Encounter for immunization: Secondary | ICD-10-CM

## 2020-02-18 ENCOUNTER — Other Ambulatory Visit: Payer: Self-pay

## 2020-02-18 ENCOUNTER — Ambulatory Visit (INDEPENDENT_AMBULATORY_CARE_PROVIDER_SITE_OTHER): Payer: Medicare Other

## 2020-02-18 DIAGNOSIS — Z23 Encounter for immunization: Secondary | ICD-10-CM

## 2020-11-24 ENCOUNTER — Encounter: Payer: Self-pay | Admitting: Family Medicine

## 2020-11-24 ENCOUNTER — Other Ambulatory Visit: Payer: Self-pay

## 2020-11-24 ENCOUNTER — Telehealth (INDEPENDENT_AMBULATORY_CARE_PROVIDER_SITE_OTHER): Payer: Medicaid Other | Admitting: Family Medicine

## 2020-11-24 VITALS — Temp 98.1°F | Ht 70.0 in | Wt 240.0 lb

## 2020-11-24 DIAGNOSIS — F840 Autistic disorder: Secondary | ICD-10-CM

## 2020-11-24 DIAGNOSIS — U071 COVID-19: Secondary | ICD-10-CM | POA: Diagnosis not present

## 2020-11-24 MED ORDER — MOLNUPIRAVIR EUA 200MG CAPSULE: 4.0000 | ORAL_CAPSULE | Freq: Two times a day (BID) | ORAL | 0 refills | Status: AC

## 2020-11-24 NOTE — Progress Notes (Signed)
Start time: 12:44 End time: 12:54  Virtual Visit via Video Note  I connected with William Weeks on 11/24/20 by a video enabled telemedicine application and verified that I am speaking with the correct person using two identifiers.  Location: Patient: home with father Provider: office   I discussed the limitations of evaluation and management by telemedicine and the availability of in person appointments. The patient expressed understanding and agreed to proceed.  History of Present Illness:  Chief Complaint  Patient presents with   Covid Positive    VIRTUAL covid positive yesterday. Nasal congestion, runny eyes and cough. Wanting anti-viral medication if possible.    Symptoms started Sunday night (9/11). Felt warm Sundaynight, was afebrile, went to bed early that night. Yesterday he started with watery eyes. According to his dad, it seems just like a little cold, no distress. Slight cough. Not winded. Normal appetite. No known fever.   Lives in group home, but he is with dad now. Both parents tested positive  Dad has been giving Tylenol every 6-8 hours if needed for headache   PMH, PSH, SH reviewed  Outpatient Encounter Medications as of 11/24/2020  Medication Sig Note   acetaminophen (TYLENOL) 500 MG tablet Take 500 mg by mouth every 6 (six) hours as needed. 11/24/2020: Last dose 9pm   clomiPRAMINE (ANAFRANIL) 25 MG capsule Take 25 mg by mouth daily.    clonazePAM (KLONOPIN) 0.5 MG tablet Take 0.5 mg by mouth 2 (two) times daily.     cloNIDine (CATAPRES) 0.1 MG tablet Take 0.2 mg by mouth at bedtime.     OLANZapine (ZYPREXA) 2.5 MG tablet Take 2.5 mg by mouth at bedtime.    PARoxetine (PAXIL-CR) 25 MG 24 hr tablet Take 25 mg by mouth 2 (two) times daily.    propranolol (INDERAL) 20 MG tablet Take 20 mg by mouth 2 (two) times daily.    albuterol (PROVENTIL HFA;VENTOLIN HFA) 108 (90 Base) MCG/ACT inhaler Inhale 1 puff into the lungs every 6 (six) hours as needed  for wheezing or shortness of breath. Reported on 03/25/2015 (Patient not taking: No sig reported)    [DISCONTINUED] chloral hydrate 500 MG/5ML syrup Take 750 mg by mouth at bedtime as needed.  08/03/2010: Extra 1/2 teaspoon, if needed.   [DISCONTINUED] Fluvoxamine Maleate 150 MG CP24 Take 150 mg by mouth daily.  03/25/2015: 100mg  twice a day   [DISCONTINUED] propranolol (INDERAL) 10 MG tablet Take 10 mg by mouth 2 (two) times daily.    No facility-administered encounter medications on file as of 11/24/2020.   Allergies  Allergen Reactions   Benadryl [Diphenhydramine Hcl]    Buspar [Buspirone Hcl]    ROS: no fever, chills, shortness of breath, n/v/d, rash.  Doesn't appear to be in significant discomfort. Can't obtain futher ROS from father.   Observations/Objective:  Temp 98.1 F (36.7 C) (Oral)   Ht 5\' 10"  (1.778 m)   Wt 240 lb (108.9 kg)   BMI 34.44 kg/m   Mainly speaking with dad. William Weeks is in the room with him--he is alert, making sounds.  Not coughing, in no distress.  Blew me a kiss through the video when I was speaking to him directly.   Assessment and Plan:  COVID-19 virus infection - Plan: molnupiravir EUA (LAGEVRIO) 200 mg CAPS capsule  Autism disorder - stable, doing well in group home.  Mucinex DM if needed for any worsening cough. Doesn't need to use Tylenol round the clock--just if warm, or appears to have any discomfort (pain, headache).  Will treat with antiviral, reviewed with dad. Reviewed supportive measures for URI symptoms, and s/sx for which he should seek immediate care. All questions answered.   Follow Up Instructions:    I discussed the assessment and treatment plan with the patient. The patient was provided an opportunity to ask questions and all were answered. The patient agreed with the plan and demonstrated an understanding of the instructions.   The patient was advised to call back or seek an in-person evaluation if the symptoms worsen or if the  condition fails to improve as anticipated.  I spent 15 minutes dedicated to the care of this patient, including pre-visit review of records, face to face time, post-visit ordering of testing and documentation.    Lavonda Jumbo, MD

## 2021-06-29 ENCOUNTER — Ambulatory Visit (HOSPITAL_COMMUNITY)
Admission: EM | Admit: 2021-06-29 | Discharge: 2021-06-29 | Disposition: A | Discharge status: Home / Self Care | Payer: Medicare Other | Attending: Psychiatry | Admitting: Psychiatry

## 2021-06-29 DIAGNOSIS — Z789 Other specified health status: Secondary | ICD-10-CM | POA: Diagnosis present

## 2021-06-29 DIAGNOSIS — F840 Autistic disorder: Secondary | ICD-10-CM | POA: Insufficient documentation

## 2021-06-29 DIAGNOSIS — F429 Obsessive-compulsive disorder, unspecified: Secondary | ICD-10-CM | POA: Insufficient documentation

## 2021-06-29 NOTE — Progress Notes (Signed)
?   06/29/21 1040  ?[facility] Triage Screening (Walk-ins at [facility] only)  ?What Is the Reason for Your Visit/Call Today? 36 year old William Weeks (non-verbal) male with history of autism, anxiety, and OCD. Information gathered by patient's father  and legal William Weeks [phone]). He report patient has anger outbursts that can last for unspecified amounts of time. Patient's psychiatrist Dr. Loni Muse unable to see patient for a least a week. Father's report no additional concerns with HI/SI and AVH. Report concern patient's OCD episodes are getting progressively worse. The family is seeking guidance on behaviroal modification techniques, resources or medication management that can be assistance until the the patient is able to see his psychiatrist. Patient lives in a home where he rents a private room with other individuals who has autism.  ?How Long Has This Been Causing You Problems? > than 6 months  ?Have You Recently Had Any Thoughts About Hurting Yourself? No  ?Are You Planning to Commit Suicide/Harm Yourself At This time? No  ?Have you Recently Had Thoughts About [place]? No  ?Are You Planning To Harm Someone At This Time? No  ?Are you currently experiencing any auditory, visual or other hallucinations? No  ?Have You Used Any Alcohol or Drugs in the Past 24 Hours? No  ?Do you have any current medical co-morbidities that require immediate attention? No  ?Clinician description of patient physical appearance/behavior: Patient is well groomed and has very supportive family.  ?What Do You Feel Would Help You the Most Today? Stress Management  ?If access to [facility] was not available, would you have sought care in the Emergency Department? No  ?Determination of Need Routine (7 days)  ?Options For Referral Other: Comment ?(assistance with community resources or behavioral modification skills to address OCD behaviors.)  ? ? ?

## 2021-06-29 NOTE — ED Provider Notes (Signed)
Behavioral Health Urgent Care Medical Screening Exam ? ?Patient Name: William Weeks ?MRN: [id number] ?Date of Evaluation: 06/29/21 ?Chief Complaint:   ?Diagnosis:  ?Final diagnoses:  ?Need for community resource  ? ?History of Present illness: William Weeks is a 36 y.o. male. Pt presents voluntarily to [facility] for walk-in assessment. Pt is accompanied by his mother, Marjean Donna, and his father, Annette Stable, who are his legal guardians. Pt is assessed face-to-face by nurse practitioner.  ? ?Pt seen w/ Marjean Donna and Annette Stable. Pt sitting down, alert, in no acute distress. Per [employer], pt has hx of Autism, Anxiety, OCD. State pt is mostly non-verbal. Endorses pt sleeps 8-9 hours/night. State pt is eating 3 meals/day. Denies concerns regarding SI/VI/HI. Reports pt has hx of flapping his hands, biting his knuckles, although reports it has been a while since these behaviors occurred. Deny hx of SA. Deny hx of AVH. Deny hx of inpatient psychiatric hospitalization. Pt is attending [employer] [facility]. Pt is connected w/ Dr. Jannifer Franklin for his medication management, next appointment is next week. Deny violent behaviors, although note that when upset pt will hold onto arm of residence staff. Deny that pt will become aggressive, hit himself, or residence staff. Report pt has had bad reactions to Buspar (incontinence) and Benadryl (paradoxical reaction). Pt is currently living in a private home managed by [facility] [employer]. Deny alcohol, marijuana, nicotine, other substance use by pt. ? ?Nurse practitioner attempted to speak w/ pt. Unclear if pt understands what nurse practitioner is saying. Pt w/ limited, incomprehensible speech.  ? ?Pt followed by Dr. Jannifer Franklin for medication management. Marjean Donna and Bill endorse good therapeutic relationship, although requesting additional referrals. Referral for [facility] provided in AVS. ? ?Psychiatric Specialty Exam ? ?Presentation  ?General  Appearance:Appropriate for Environment; Casual ? ?Eye Contact:Minimal ? ?Speech:-- (pt is non verbal) ? ?Speech Volume:Normal ? ?Handedness:No data recorded ? ?Mood and Affect  ?Mood:-- (pt is non verbal) ? ?Affect:Constricted ? ? ?Thought Process  ?Thought Processes:-- (pt is non verbal) ? ?Descriptions of Associations:-- (pt is non verbal) ? ?Orientation:-- (pt is non verbal) ? ?Thought Content:-- (pt is non verbal) ?   Hallucinations:-- (pt is non verbal) ? ?Ideas of Reference:-- (pt is non verbal) ? ?Suicidal Thoughts:-- (pt is non verbal) ? ?Homicidal Thoughts:-- (pt is non verbal) ? ? ?Sensorium  ?Memory:-- (pt is non verbal) ? ?Judgment:-- (pt is non verbal) ? ?Insight:-- (pt is non verbal) ? ? ?Executive Functions  ?Concentration:-- (pt is non verbal) ? ?Attention Span:-- (pt is non verbal) ? ?Recall:-- (pt is non verbal\) ? ?Fund of Knowledge:-- (pt is nonverbal) ? ?Language:-- (pt is nonverbal) ? ? ?Psychomotor Activity  ?Psychomotor Activity:-- (mild restlessness) ? ? ?Assets  ?Assets:Financial Resources/Insurance; Housing; Social Support ? ? ?Sleep  ?Sleep:-- (pt is non verbal) ? ?Number of hours: No data recorded ? ?No data recorded ? ?Physical Exam: ?Physical Exam ?HENT:  ?   Head: Normocephalic and atraumatic.  ?Cardiovascular:  ?   Rate and Rhythm: Normal rate.  ?Pulmonary:  ?   Effort: Pulmonary effort is normal.  ?Neurological:  ?   Mental Status: He is alert.  ?Psychiatric:  ?   Comments: Pt is non verbal  ? ?Review of Systems  ?Unable to perform ROS: Patient nonverbal (pt is non-verbal)  ?Blood pressure 120/86, pulse 75, temperature 98.6 ?F (37 ?C), temperature source Oral, resp. rate 18, SpO2 94 %. There is no height or weight on file to calculate BMI. ? ?Musculoskeletal: ?Strength & Muscle  Tone: within normal limits ?Gait & Station: normal ?Patient leans: N/A ? ?[facility] Discharge Disposition for Follow up and Recommendations: ?Based on my evaluation the patient does not appear to have an  emergency medical condition and can be discharged with resources and follow up care in outpatient services for Medication Management and Individual Therapy ? ?Lauree Chandler, NP ?06/29/2021, 5:23 PM ?

## 2021-06-29 NOTE — Discharge Instructions (Addendum)
Follow up w/ your psychiatrist, Dr. Jannifer Franklin, for medication management. ? ?     [facility] ?     [address] ?     [place], [place] [address] ?     [phone]  ? ?As an alternative, contact [facility].  They offer psychiatry and therapy.  If you decide to change providers, please contact the [facility] to inform them, especially before any previously scheduled appointments: ? ?     [facility] ?     [address] ?     [place], [place] [address] ?     [phone] ? ?

## 2021-06-29 NOTE — BH Assessment (Signed)
[facility] Assessment Progress Note ?  ?Per Darrick Grinder, NP, this pt does not require psychiatric hospitalization at this time.  Pt is psychiatrically cleared.  Pt is currently being seen by Thedore Mins, MD at the [facility], but the family is requesting alternative referrals.  Discharge instructions include information for [facility].  Adela Lank has been notified. ? ?Doylene Canning, MA ?Triage Specialist ?[phone] ? ?

## 2021-07-21 ENCOUNTER — Telehealth: Payer: Self-pay

## 2021-07-21 NOTE — Telephone Encounter (Signed)
Patient's father prefers to bring him here rather than have psych give rx and go to a lab. Scheduled for 08/03/21 @ 3:30. ?

## 2021-07-21 NOTE — Telephone Encounter (Signed)
Pts. Dad called stating his son has been having more episodes of aggressive behavior lately for the past few months. It doesn't happen all the time but enough that they are worried about it. They discussed this with his Pysc doctor and he suggested maybe having his testosterone level checked. They wanted to know if this was something he could get done here and what your thought or recommendations would be.  ?

## 2021-07-21 NOTE — Telephone Encounter (Signed)
Not sure that would be helpful, without him being on any kind of testosterone. We haven't seen him in a while, so not sure if our med list is up to date.  Happy to see him for a visit (30 min), and draw labs if appropriate--assuming that we would be able to draw his labs in the office. ?Otherwise, his psych could probably give rx with orders to get labs checked elsewhere. ? ?According to our records, he hasn't had any labs drawn since 12/2008, and would be due for full panel (which would not typically include testosterone, but could be done if needed per psych) ? ?(Had virtual in 2022, otherwise not seen in office since 2019) ?

## 2021-08-03 ENCOUNTER — Institutional Professional Consult (permissible substitution): Payer: Medicaid Other | Admitting: Family Medicine

## 2021-08-09 NOTE — Progress Notes (Unsigned)
   William Weeks was taken to [facility] by his parents in April. Reports note: Pt is attending [employer] day program. Pt sees Dr. Darleene Cleaver for his medication management. Deny violent behaviors, although note that when upset pt will hold onto arm of residence staff. Deny that pt will become aggressive, hit himself, or residence staff. Pt is currently living in a private home managed by [facility] [employer].  His dad, William Weeks, called here earlier this month reporting that he has been having more episodes of aggressive behavior over the past few months. It doesn't happen all the time but enough that they are worried about it. They discussed this with his psychiatrist, who suggested maybe having his testosterone level checked. Father preferred to bring him here rather than getting lab orders from psychiatrist and taking him to a lab.  Patient hasn't been seen in this office since 2019 (had virtual visit for COVID in 11/2020), no labs since 2010.   PMH, PSH, SH reviewed    ROS:   PHYSICAL EXAM:  There were no vitals taken for this visit.  Wt Readings from Last 3 Encounters:  11/24/20 240 lb (108.9 kg)  06/04/17 243 lb 9.6 oz (110.5 kg)  04/16/17 246 lb 3.2 oz (111.7 kg)      ASSESSMENT/PLAN:   Cbc, c-met, A1c (with labs, if high BMI), TSH, testosterone  Not sure the benefit of checking testosterone (unless psych considering hormonal therapy in treating aggressive behavior).

## 2021-08-10 ENCOUNTER — Ambulatory Visit (INDEPENDENT_AMBULATORY_CARE_PROVIDER_SITE_OTHER): Payer: Medicare Other | Admitting: Family Medicine

## 2021-08-10 ENCOUNTER — Encounter: Payer: Self-pay | Admitting: Family Medicine

## 2021-08-10 VITALS — BP 110/80 | HR 100 | Ht 70.0 in | Wt 236.0 lb

## 2021-08-10 DIAGNOSIS — Z5181 Encounter for therapeutic drug level monitoring: Secondary | ICD-10-CM

## 2021-08-10 DIAGNOSIS — Z6833 Body mass index (BMI) 33.0-33.9, adult: Secondary | ICD-10-CM

## 2021-08-10 DIAGNOSIS — F840 Autistic disorder: Secondary | ICD-10-CM

## 2021-08-10 DIAGNOSIS — F411 Generalized anxiety disorder: Secondary | ICD-10-CM

## 2021-08-10 DIAGNOSIS — Z79899 Other long term (current) drug therapy: Secondary | ICD-10-CM

## 2021-08-10 DIAGNOSIS — R4689 Other symptoms and signs involving appearance and behavior: Secondary | ICD-10-CM

## 2021-08-10 DIAGNOSIS — R000 Tachycardia, unspecified: Secondary | ICD-10-CM

## 2021-08-10 DIAGNOSIS — Z131 Encounter for screening for diabetes mellitus: Secondary | ICD-10-CM

## 2021-08-11 LAB — COMPREHENSIVE METABOLIC PANEL
ALT: 39 IU/L (ref 0–44)
AST: 22 IU/L (ref 0–40)
Albumin/Globulin Ratio: 1.9 (ref 1.2–2.2)
Albumin: 4.5 g/dL (ref 4.0–5.0)
Alkaline Phosphatase: 68 IU/L (ref 44–121)
BUN/Creatinine Ratio: 14 (ref 9–20)
BUN: 15 mg/dL (ref 6–20)
Bilirubin Total: 0.7 mg/dL (ref 0.0–1.2)
CO2: 25 mmol/L (ref 20–29)
Calcium: 9.8 mg/dL (ref 8.7–10.2)
Chloride: 100 mmol/L (ref 96–106)
Creatinine, Ser: 1.06 mg/dL (ref 0.76–1.27)
Globulin, Total: 2.4 g/dL (ref 1.5–4.5)
Glucose: 83 mg/dL (ref 70–99)
Potassium: 4.2 mmol/L (ref 3.5–5.2)
Sodium: 141 mmol/L (ref 134–144)
Total Protein: 6.9 g/dL (ref 6.0–8.5)
eGFR: 94 mL/min/{1.73_m2} (ref >59)

## 2021-08-11 LAB — CBC WITH DIFFERENTIAL/PLATELET
Basophils Absolute: 0.1 10*3/uL (ref 0.0–0.2)
Basos: 1 %
EOS (ABSOLUTE): 0.3 10*3/uL (ref 0.0–0.4)
Eos: 3 %
Hematocrit: 48.3 % (ref 37.5–51.0)
Hemoglobin: 16.4 g/dL (ref 13.0–17.7)
Immature Grans (Abs): 0 10*3/uL (ref 0.0–0.1)
Immature Granulocytes: 0 %
Lymphocytes Absolute: 2 10*3/uL (ref 0.7–3.1)
Lymphs: 26 %
MCH: 30.9 pg (ref 26.6–33.0)
MCHC: 34 g/dL (ref 31.5–35.7)
MCV: 91 fL (ref 79–97)
Monocytes Absolute: 0.5 10*3/uL (ref 0.1–0.9)
Monocytes: 6 %
Neutrophils Absolute: 5 10*3/uL (ref 1.4–7.0)
Neutrophils: 64 %
Platelets: 224 10*3/uL (ref 150–450)
RBC: 5.3 x10E6/uL (ref 4.14–5.80)
RDW: 12.9 % (ref 11.6–15.4)
WBC: 7.8 10*3/uL (ref 3.4–10.8)

## 2021-08-11 LAB — TSH: TSH: 0.489 u[IU]/mL (ref 0.450–4.500)

## 2021-08-11 LAB — HEMOGLOBIN A1C
Est. average glucose Bld gHb Est-mCnc: 105 mg/dL
Hgb A1c MFr Bld: 5.3 % (ref 4.8–5.6)

## 2021-08-11 LAB — TESTOSTERONE: Testosterone: 346 ng/dL (ref 264–916)

## 2021-11-16 ENCOUNTER — Encounter: Payer: Self-pay | Admitting: Internal Medicine

## 2021-12-20 ENCOUNTER — Encounter: Payer: Self-pay | Admitting: Internal Medicine

## 2022-01-25 ENCOUNTER — Encounter: Payer: Self-pay | Admitting: Family Medicine

## 2022-01-25 ENCOUNTER — Ambulatory Visit (INDEPENDENT_AMBULATORY_CARE_PROVIDER_SITE_OTHER): Payer: Medicare Other | Admitting: Family Medicine

## 2022-01-25 VITALS — BP 110/64 | HR 118 | Wt 224.4 lb

## 2022-01-25 DIAGNOSIS — R159 Full incontinence of feces: Secondary | ICD-10-CM | POA: Diagnosis not present

## 2022-01-25 DIAGNOSIS — Z23 Encounter for immunization: Secondary | ICD-10-CM | POA: Diagnosis not present

## 2022-01-25 DIAGNOSIS — R32 Unspecified urinary incontinence: Secondary | ICD-10-CM | POA: Diagnosis not present

## 2022-01-25 DIAGNOSIS — R195 Other fecal abnormalities: Secondary | ICD-10-CM

## 2022-01-25 DIAGNOSIS — R350 Frequency of micturition: Secondary | ICD-10-CM | POA: Diagnosis not present

## 2022-01-25 DIAGNOSIS — F840 Autistic disorder: Secondary | ICD-10-CM

## 2022-01-25 LAB — POCT URINALYSIS DIP (PROADVANTAGE DEVICE)
Bilirubin, UA: NEGATIVE
Blood, UA: NEGATIVE
Glucose, UA: NEGATIVE mg/dL
Ketones, POC UA: NEGATIVE mg/dL
Leukocytes, UA: NEGATIVE
Nitrite, UA: NEGATIVE
Protein Ur, POC: NEGATIVE mg/dL
Specific Gravity, Urine: 1.01
Urobilinogen, Ur: 0.2
pH, UA: 7 (ref 5.0–8.0)

## 2022-01-25 MED ORDER — CIPROFLOXACIN HCL 500 MG PO TABS: 500.0000 mg | ORAL_TABLET | Freq: Two times a day (BID) | ORAL | 0 refills | Status: AC

## 2022-01-25 NOTE — Patient Instructions (Signed)
Follow a high fiber diet. Limit the dairy for at least a week to see if things improve (regarding bowels) Keep a record of food and symptoms to see if there are any clear triggers.  Take a probiotic once daily --thismight help with bowels (especially if any worsening related to the antibiotics).  Cut back on caffeine, which can cause more urinary symptoms/frequency. Try a caffeine-free soda.  Take the cipro twice daily for 2 weeks. If there are any issues, side effects or problems, please let us know.  If not improved after the antibiotics, I think a referral to a urologist would be a good idea (They can easily check for how much urine is left in the bladder after emptying in the bathroom).

## 2022-01-25 NOTE — Progress Notes (Unsigned)
Chief Complaint  Patient presents with   concerns     Having incontinence and fecal issues. Not sure if this behavior issues.  Gets about 100oz of water a day and then 20oz of dr. Reino Kent. Varies through the day when he has the issues. Going to the bathroom and will stand over toliet for 10-12 mins but then will come out and his pants be wet   Patient is brought in by his father, and accompanied by Karen--worker at the day program he attends, to add to the history.  Mom was on speaker phone during the visit as well.  They have concerns with urinary and fecal incontinence issues. This has been going on for the last several months. With his autism, he has always been interested in water, bathrooms, obsession with going to the bathroom, goes back and forth frequently.    Dad reports he always used to void while seated, but has been standing.  He can stand for a very long time, not much comes out, he will pull and try and get more out, and then may have leakage as he leaves the bathroom, wetting the front of his pants.  He has also had more fecal accidents, including in the pool.  Eating more fruits and vegetables, less bread. Some ice cream (small cups), not much milk, some yogurt. Not aware of any trigger for loose stools.  Clydie Braun wonders if he may be constipated. Earlier this week he had 2 large episodes of loose stool (in toilet, didn't flush), and noted once a large amount of solid stool.  Hasn't seen any small hard balls. Denies any blood or mucus in the stool.  Mother reports that when he was home with her over the weekend, that there were no accidents.  Ran to the bathroom once.  They deny any recent changes to medications--did have adjustment of olanzaprine and paxil, increased. No other side effects.  Mom would like to have the home help with tracking diet/bowels, to see if they can find a trigger, to log food/accidents/activities  PMH, PSH, SH reviewed  Outpatient Encounter  Medications as of 01/25/2022  Medication Sig Note   albuterol (PROVENTIL HFA;VENTOLIN HFA) 108 (90 Base) MCG/ACT inhaler Inhale 1 puff into the lungs every 6 (six) hours as needed for wheezing or shortness of breath. Reported on 03/25/2015 08/10/2021: prn   ciprofloxacin (CIPRO) 500 MG tablet Take 1 tablet (500 mg total) by mouth 2 (two) times daily.    clomiPRAMINE (ANAFRANIL) 25 MG capsule Take 25 mg by mouth daily.    clonazePAM (KLONOPIN) 0.5 MG tablet Take 0.5 mg by mouth 2 (two) times daily.     cloNIDine (CATAPRES) 0.1 MG tablet Take 0.2 mg by mouth at bedtime.     OLANZapine (ZYPREXA) 5 MG tablet Take by mouth. 01/25/2022: 1 in the am and 2 at night   PARoxetine (PAXIL-CR) 25 MG 24 hr tablet Take 25 mg by mouth 2 (two) times daily.    [DISCONTINUED] propranolol ER (INDERAL LA) 60 MG 24 hr capsule Take 60 mg by mouth daily.    No facility-administered encounter medications on file as of 01/25/2022.   Allergies  Allergen Reactions   Benadryl [Diphenhydramine Hcl]    Buspar [Buspirone Hcl]    ROS:  no fever, chills, URI symptoms, shortness of breath.  +urinary frequency, incontinence, intermittent loose stools and fecal incontinence per HPI.  They can't really tell if having urgency/frequency, due to his obsession with the bathroom longterm. He doesn't to experience  any discomfort or pain with voiding, no hematuria. They are not aware of any sexual intercourse or inappropriate behaviors.   PHYSICAL EXAM:  BP 110/64   Pulse (!) 118   Wt 224 lb 6.4 oz (101.8 kg)   BMI 32.20 kg/m   Wt Readings from Last 3 Encounters:  01/25/22 224 lb 6.4 oz (101.8 kg)  08/10/21 236 lb (107 kg)  11/24/20 240 lb (108.9 kg)   Pleasant male, somewhat agitated, but distractable.  He keeps trying to leave the room, repeats "4 minutes" or whatever time dad told him, until it is time to leave. He is alert.  He listens to commands.  He isn't in any distress. HEENT: conjunctiva and sclera are clear,  EOMI. Neck: no lymphadenopathy or mass Heart: tachycardic, no murmur Lungs: clear bilaterally Back: no appreciable tenderness or CVA tenderness Abdomen: obese, soft, nontender Neuro: normal gait, strength  Urine dip: SG 1.010, normal  ASSESSMENT/PLAN:  Urinary incontinence, unspecified type - No e/o URI per urine dip. Unclear if having hesitancy, weakened stream or dribbling (diff to get hx). Will treat presumptively for prostatitis - Plan: POCT Urinalysis DIP (Proadvantage Device)  Urinary frequency - small volumes, and some leakage.  normal urine. ?poss prostatitis. Father prefers presumptive treatment with ABX rather than exam. Urology if not improving - Plan: GC/Chlamydia Probe Amp  Incontinence of feces, unspecified fecal incontinence type - related to when stools are loose. No back pain or leg weakness or numbness  Loose stools - to keep log of food/bowels per mother's request. Suggested dairy-free trial, and cont high fiber diet  Need for influenza vaccination - Plan: Flu Vaccine QUAD 6+ mos PF IM (Fluarix Quad PF)  Autism disorder  Father states that his OCD behaviors are different in group home than at his parents home. Hard to put all the pieces together today, with 3 different historians giving different information.  Reviewed diet in detail--cut back on caffeine, trial dairy-free, high fiber diet. Reviewed risks/SE of ABX; prefers presumptive treatment with ABX for urine/prostate. Can refer to urologist if ongoing issues.    Follow a high fiber diet. Limit the dairy for at least a week to see if things improve (regarding bowels) Keep a record of food and symptoms to see if there are any clear triggers.  Take a probiotic once daily --thismight help with bowels (especially if any worsening related to the antibiotics).  Cut back on caffeine, which can cause more urinary symptoms/frequency. Try a caffeine-free soda.  Take the cipro twice daily for 2 weeks. If there  are any issues, side effects or problems, please let us know.

## 2022-01-28 LAB — GC/CHLAMYDIA PROBE AMP
Chlamydia trachomatis, NAA: NEGATIVE
Neisseria Gonorrhoeae by PCR: NEGATIVE

## 2022-01-30 ENCOUNTER — Telehealth: Payer: Self-pay | Admitting: Family Medicine

## 2022-01-30 ENCOUNTER — Encounter: Payer: Self-pay | Admitting: *Deleted

## 2022-01-30 NOTE — Telephone Encounter (Signed)
Done

## 2022-01-30 NOTE — Telephone Encounter (Signed)
Please put on letter and email to dad as requested  To Whom It May Concern:  New medications added to William Weeks's regular regimen include:  Ciprofloxacin 500mg  1 tablet twice daily (#28 ordered on 01/25/22, 2 week course). Daily Probiotic 5 billion CFU (1 daily). This is intended to be taken for longer--taken daily, to help with bowels.  Please contact 01/27/22 with any issues or concerns.   Sincerely,   [place], MD

## 2022-01-30 NOTE — Telephone Encounter (Signed)
Received a call from pt's father. He states pt's group home needs a letter on letter head with specific instructions for pt's medications. He states that pt was given a antibiotic and was told to take a probiotic. He states that pt is taking Daily Probiotic 5 billion CFU. He states that is how it needs to be written on letter head along with antibiotic. Please email letter to pt's father at [email or web address].

## 2024-01-14 ENCOUNTER — Ambulatory Visit (INDEPENDENT_AMBULATORY_CARE_PROVIDER_SITE_OTHER): Payer: MEDICAID

## 2024-01-14 DIAGNOSIS — Z23 Encounter for immunization: Secondary | ICD-10-CM
